# Patient Record
Sex: Male | Born: 1995 | Race: Black or African American | Hispanic: No | Marital: Married | Smoking: Never smoker
Health system: Southern US, Community
[De-identification: ages and names within clinical notes are randomized; demographics above are authoritative.]

---

## 2021-05-17 ENCOUNTER — Other Ambulatory Visit: Payer: Self-pay

## 2021-05-17 ENCOUNTER — Emergency Department (HOSPITAL_COMMUNITY): Payer: Self-pay

## 2021-05-17 ENCOUNTER — Emergency Department (HOSPITAL_COMMUNITY)
Admission: EM | Admit: 2021-05-17 | Discharge: 2021-05-18 | Disposition: A | Discharge status: Home / Self Care | Payer: Self-pay | Attending: Emergency Medicine | Admitting: Emergency Medicine

## 2021-05-17 ENCOUNTER — Encounter (HOSPITAL_COMMUNITY): Payer: Self-pay | Admitting: Emergency Medicine

## 2021-05-17 DIAGNOSIS — X509XXA Other and unspecified overexertion or strenuous movements or postures, initial encounter: Secondary | ICD-10-CM | POA: Insufficient documentation

## 2021-05-17 DIAGNOSIS — Y990 Civilian activity done for income or pay: Secondary | ICD-10-CM | POA: Insufficient documentation

## 2021-05-17 DIAGNOSIS — M542 Cervicalgia: Secondary | ICD-10-CM | POA: Insufficient documentation

## 2021-05-17 DIAGNOSIS — Y9289 Other specified places as the place of occurrence of the external cause: Secondary | ICD-10-CM | POA: Insufficient documentation

## 2021-05-17 NOTE — ED Provider Notes (Signed)
Emergency Medicine Provider Triage Evaluation Note  Mathew Brown , a 24 y.o. male  was evaluated in triage.  Pt complains of neck pain after lifting heavy recliner. Felt a pop in the back of his neck. Pain to the neck and left shoulder, worse with  movement.   Review of Systems  Positive: Neck pain, arthralgias  Negative: Numbness, weakness  Physical Exam  BP 135/73 (BP Location: Right Arm)   Pulse (!) 58   Temp 98.6 F (37 C) (Oral)   Resp 20   SpO2 100%  Gen:   Awake, no distress   Resp:  Normal effort  MSK:   Diffuse cervical tenderness- midline and bilateral paraspinal muscles as well as diffuse L glenohumeral joint tenderness. Sensation grossly intact to UES. 5/5 symmetric grip strength.   Medical Decision Making  Medically screening exam initiated at 11:04 PM.  Appropriate orders placed.  [name] was informed that the remainder of the evaluation will be completed by another provider, this initial triage assessment does not replace that evaluation, and the importance of remaining in the ED until their evaluation is complete.  Neck pain   Cherly Anderson, PA-C 05/17/21 2305    Virgina Norfolk, DO 05/18/21 [phone]

## 2021-05-17 NOTE — ED Triage Notes (Signed)
Pt c/o neck pain after lifting a recliner at work today.

## 2021-05-18 ENCOUNTER — Encounter (HOSPITAL_COMMUNITY): Payer: Self-pay | Admitting: Student

## 2021-05-18 MED ORDER — LIDOCAINE 5 % EX PTCH
2.0000 | MEDICATED_PATCH | CUTANEOUS | Status: DC
  Administered 2021-05-18: 2 via TRANSDERMAL
  Filled 2021-05-18: qty 2

## 2021-05-18 MED ORDER — KETOROLAC TROMETHAMINE 60 MG/2ML IM SOLN
30.0000 mg | Freq: Once | INTRAMUSCULAR | Status: AC
  Administered 2021-05-18: 30 mg via INTRAMUSCULAR
  Filled 2021-05-18: qty 2

## 2021-05-18 MED ORDER — LIDOCAINE 5 % EX PTCH: 1.0000 | MEDICATED_PATCH | Freq: Every day | CUTANEOUS | 0 refills | Status: DC | PRN

## 2021-05-18 MED ORDER — METHOCARBAMOL 500 MG PO TABS: 500.0000 mg | ORAL_TABLET | Freq: Three times a day (TID) | ORAL | 0 refills | Status: DC | PRN

## 2021-05-18 MED ORDER — NAPROXEN 500 MG PO TABS: 500.0000 mg | ORAL_TABLET | Freq: Two times a day (BID) | ORAL | 0 refills | Status: DC | PRN

## 2021-05-18 NOTE — Discharge Instructions (Signed)
You were seen in the emergency department for neck pain.  At this time we suspect that your pain is related to a muscle strain/spasm.   Your CT scan and x-ray did not show findings of fractures.   I have prescribed you an anti-inflammatory medication and a muscle relaxer.  - Naproxen is a nonsteroidal anti-inflammatory medication that will help with pain and swelling. Be sure to take this medication as prescribed with food, 1 pill every 12 hours,  It should be taken with food, as it can cause stomach upset, and more seriously, stomach bleeding. Do not take other nonsteroidal anti-inflammatory medications with this such as Advil, Motrin, Aleve, Mobic, Goodie Powder, or Motrin.    - Robaxin is the muscle relaxer I have prescribed, this is meant to help with muscle tightness. Be aware that this medication may make you drowsy therefore the first time you take this it should be at a time you are in an environment where you can rest. Do not drive or operate heavy machinery when taking this medication. Do not drink alcohol or take other sedating medications with this medicine such as narcotics or benzodiazepines.   - Lidoderm patch- apply 1 patch to the area of most significant pain  You make take Tylenol per over the counter dosing with these medications.   We have prescribed you new medication(s) today. Discuss the medications prescribed today with your pharmacist as they can have adverse effects and interactions with your other medicines including over the counter and prescribed medications. Seek medical evaluation if you start to experience new or abnormal symptoms after taking one of these medicines, seek care immediately if you start to experience difficulty breathing, feeling of your throat closing, facial swelling, or rash as these could be indications of a more serious allergic reaction   The application of heat can help soothe the pain.   Follow up with primary care within 1 week. Return to the  ER for new or worsening symptoms including but not limited to new or worsening pain, numbness, weakness, trouble walking, re-injury, or any other concerns.

## 2021-05-18 NOTE — ED Provider Notes (Signed)
[facility] EMERGENCY DEPARTMENT Provider Note   CSN: [id number] Arrival date & time: 05/17/21  2240     History Chief Complaint  Patient presents with   Neck Pain    Mathew Brown is a 25 y.o. male without significant past medical history who presents to the emergency department with complaints of neck pain after lifting a heavy recliner at work earlier today.  Patient states that he was lifting something heavy felt somewhat of a pop sensation in his neck and has since had pain throughout more so on the left side.  Pain is worse with movement.  No alleviating factors.  No intervention prior to arrival.  Patient denies numbness, tingling, weakness, incontinence, or saddle anesthesia.  Denies any other areas of injury.  HPI     History reviewed. No pertinent past medical history.  There are no problems to display for this patient.   History reviewed. No pertinent surgical history.     No family history on file.     Home Medications Prior to Admission medications   Not on File    Allergies    Patient has no allergy information on record.  Review of Systems   Review of Systems  Constitutional:  Negative for chills, fever and unexpected weight change.  Gastrointestinal:  Negative for abdominal pain, nausea and vomiting.  Genitourinary:  Negative for dysuria.  Musculoskeletal:  Positive for neck pain.  Neurological:  Negative for weakness and numbness.       Negative for saddle anesthesia or bowel/bladder incontinence.   All other systems reviewed and are negative.  Physical Exam Updated Vital Signs BP 128/60 (BP Location: Right Arm)   Pulse (!) 53   Temp 98.6 F (37 C) (Oral)   Resp 18   SpO2 100%   Physical Exam Vitals and nursing note reviewed.  Constitutional:      General: He is not in acute distress.    Appearance: He is well-developed. He is not toxic-appearing.  HENT:     Head: Normocephalic and atraumatic.  Eyes:     General:         Right eye: No discharge.        Left eye: No discharge.     Conjunctiva/sclera: Conjunctivae normal.  Cardiovascular:     Rate and Rhythm: Normal rate and regular rhythm.     Comments: 2+ symmetric radial pulses.  Pulmonary:     Effort: Pulmonary effort is normal. No respiratory distress.     Breath sounds: Normal breath sounds. No wheezing, rhonchi or rales.  Abdominal:     General: There is no distension.     Palpations: Abdomen is soft.     Tenderness: There is no abdominal tenderness. There is no guarding or rebound.  Musculoskeletal:     Cervical back: Tenderness (diffuse midline and bilateral paraspinal muscle more so to the left) present.     Comments: Upper extremities: No obvious deformities or significant open wounds.  Patient is actively ranging all major joints.  He is tender outpatient to the left diffuse glenohumeral joint as well as to the left trapezius muscle and proximal one third of the humerus.  Otherwise nontender.  Compartments are soft.  Skin:    General: Skin is warm and dry.     Capillary Refill: Capillary refill takes less than 2 seconds.     Findings: No rash.  Neurological:     Mental Status: He is alert.     Comments: Sensation grossly intact  to bilateral upper extremities. 5/5 symmetric grip strength. Ambulatory. Clear speech.   Psychiatric:        Behavior: Behavior normal.    ED Results / Procedures / Treatments   Labs (all labs ordered are listed, but only abnormal results are displayed) Labs Reviewed - No data to display  EKG None  Radiology CT Cervical Spine Wo Contrast  Result Date: 05/17/2021 CLINICAL DATA:  Neck trauma, midline tenderness (Age 63-64y) EXAM: CT CERVICAL SPINE WITHOUT CONTRAST TECHNIQUE: Multidetector CT imaging of the cervical spine was performed without intravenous contrast. Multiplanar CT image reconstructions were also generated. COMPARISON:  None. FINDINGS: Alignment: Straightening of normal lordosis. No traumatic  subluxation. Skull base and vertebrae: No acute fracture. Vertebral body heights are maintained. The dens and skull base are intact. Soft tissues and spinal canal: No prevertebral fluid or swelling. No visible canal hematoma. Disc levels:  Preserved. Upper chest: Negative. Other: None. IMPRESSION: Straightening of normal lordosis typically seen with positioning or muscle spasm. No acute fracture or traumatic subluxation of the cervical spine. Electronically Signed   By: Narda Rutherford M.D.   On: 05/17/2021 23:34   DG Shoulder Left  Result Date: 05/17/2021 CLINICAL DATA:  Pain EXAM: LEFT SHOULDER - 2+ VIEW COMPARISON:  None. FINDINGS: There is no evidence of fracture or dislocation. There is no evidence of arthropathy or other focal bone abnormality. Soft tissues are unremarkable. IMPRESSION: Negative. Electronically Signed   By: Tish Frederickson M.D.   On: 05/17/2021 23:21    Procedures Procedures   Medications Ordered in ED Medications - No data to display  ED Course  I have reviewed the triage vital signs and the nursing notes.  Pertinent labs & imaging results that were available during my care of the patient were reviewed by me and considered in my medical decision making (see chart for details).    MDM Rules/Calculators/A&P                          Patient presents to the ED with complaints of neck pain after heavy lifting earlier today. Nontoxic, vitals w/o significant abnormality.   Additional history obtained:  Additional history obtained from chart review & nursing note review.   Imaging Studies ordered:  I ordered imaging studies which included CT C spine & L shoulder x-ray, I independently reviewed, formal radiology impression shows:  CT C-spine: Straightening of normal lordosis typically seen with positioning or muscle spasm. No acute fracture or traumatic subluxation of the cervical spine. L shoulder x-ray: Straightening of normal lordosis typically seen with positioning or  muscle spasm. No acute fracture or traumatic subluxation of the cervical spine.  ED Course:  X-ray & CT imaging reassuring- no acute fracture.  No neuro deficits to suggest cord compression.  NVI distally.  Suspect muscular in nature.  Will treat with naproxen, Robaxin, and Lidoderm patches.  Recommended application of heat.  PCP follow-up.  I discussed results, treatment plan, need for follow-up, and return precautions with the patient. Provided opportunity for questions, patient confirmed understanding and is in agreement with plan.   Portions of this note were generated with Scientist, clinical (histocompatibility and immunogenetics). Dictation errors may occur despite best attempts at proofreading.  Final Clinical Impression(s) / ED Diagnoses Final diagnoses:  Neck pain    Rx / DC Orders ED Discharge Orders          Ordered    methocarbamol (ROBAXIN) 500 MG tablet  Every 8 hours PRN  05/18/21 0400    naproxen (NAPROSYN) 500 MG tablet  2 times daily PRN        05/18/21 0400    lidocaine (LIDODERM) 5 %  Daily PRN        05/18/21 0400             Akari Crysler, Pleas Koch, PA-C 05/18/21 0414    Nira Conn, MD 05/26/21 [phone]

## 2021-05-22 ENCOUNTER — Emergency Department (HOSPITAL_COMMUNITY)
Admission: EM | Admit: 2021-05-22 | Discharge: 2021-05-22 | Disposition: A | Discharge status: Home / Self Care | Payer: Self-pay | Attending: Physician Assistant | Admitting: Physician Assistant

## 2021-05-22 ENCOUNTER — Encounter (HOSPITAL_COMMUNITY): Payer: Self-pay | Admitting: Emergency Medicine

## 2021-05-22 ENCOUNTER — Other Ambulatory Visit: Payer: Self-pay

## 2021-05-22 DIAGNOSIS — M5412 Radiculopathy, cervical region: Secondary | ICD-10-CM | POA: Insufficient documentation

## 2021-05-22 DIAGNOSIS — G8929 Other chronic pain: Secondary | ICD-10-CM | POA: Insufficient documentation

## 2021-05-22 DIAGNOSIS — X500XXA Overexertion from strenuous movement or load, initial encounter: Secondary | ICD-10-CM | POA: Insufficient documentation

## 2021-05-22 DIAGNOSIS — M25569 Pain in unspecified knee: Secondary | ICD-10-CM | POA: Insufficient documentation

## 2021-05-22 DIAGNOSIS — Y990 Civilian activity done for income or pay: Secondary | ICD-10-CM | POA: Insufficient documentation

## 2021-05-22 MED ORDER — PREDNISONE 10 MG PO TABS: ORAL_TABLET | ORAL | 0 refills | Status: DC

## 2021-05-22 NOTE — ED Provider Notes (Signed)
[facility] EMERGENCY DEPARTMENT Provider Note   CSN: [id number] Arrival date & time: 05/22/21  0116     History Chief Complaint  Patient presents with   Knee Pain / Neck Pain     Mathew Brown is a 25 y.o. male presents to the Emergency Department complaining of gradual, persistent, progressively worsening neck pain after injury on Friday.  Patient reports he was lifting a recliner at work when he felt a popping sensation in his neck.  He was evaluated in the emergency department on 05/17/2021 with normal CT scan.  Was given naproxen, Robaxin and lidocaine patches.  He reports these have not been helping significantly however he has not been taking them as prescribed either.  Patient reports he continues to have neck pain and stiffness that radiates into his shoulders but not down his arms.  No numbness, tingling or weakness.  No difficulty walking, loss of bowel or bladder control.  Patient reports some pain in his knees however this is chronic and unchanged.  Movement and palpation seem to make his symptoms worse.  Nothing seems to make them better.     The history is provided by the patient and medical records. No language interpreter was used.      History reviewed. No pertinent past medical history.  There are no problems to display for this patient.   History reviewed. No pertinent surgical history.     No family history on file.  Social History   Tobacco Use   Smoking status: Never   Smokeless tobacco: Never  Substance Use Topics   Alcohol use: Never   Drug use: Never    Home Medications Prior to Admission medications   Medication Sig Start Date End Date Taking? Authorizing Provider  predniSONE (DELTASONE) 10 MG tablet 6 tabs po daily x 3 days, then 4 tabs x 3 days, then 3 tabs x 3 days, then 2 tab x 3 days, then 1 tabs x 3 days 05/22/21  Yes Byrne Capek, Dahlia Client, PA-C  lidocaine (LIDODERM) 5 % Place 1 patch onto the skin daily as needed. Apply  patch to area most significant pain once per day.  Remove and discard patch within 12 hours of application. 05/18/21   Petrucelli, Samantha R, PA-C  methocarbamol (ROBAXIN) 500 MG tablet Take 1 tablet (500 mg total) by mouth every 8 (eight) hours as needed for muscle spasms. 05/18/21   Petrucelli, Samantha R, PA-C  naproxen (NAPROSYN) 500 MG tablet Take 1 tablet (500 mg total) by mouth 2 (two) times daily as needed for moderate pain. 05/18/21   Petrucelli, Pleas Koch, PA-C    Allergies    Patient has no known allergies.  Review of Systems   Review of Systems  Constitutional:  Negative for fatigue and fever.  Respiratory:  Negative for chest tightness and shortness of breath.   Cardiovascular:  Negative for chest pain.  Gastrointestinal:  Negative for abdominal pain, diarrhea, nausea and vomiting.  Genitourinary:  Negative for dysuria, frequency, hematuria and urgency.  Musculoskeletal:  Positive for neck pain. Negative for back pain, gait problem, joint swelling and neck stiffness.  Skin:  Negative for rash.  Neurological:  Negative for weakness, light-headedness, numbness and headaches.  All other systems reviewed and are negative.  Physical Exam Updated Vital Signs BP 126/70 (BP Location: Right Arm)   Pulse (!) 51   Temp 98.6 F (37 C)   Resp 16   Ht 5\' 10"  (1.778 m)   Wt 95 kg  SpO2 100%   BMI 30.05 kg/m   Physical Exam Vitals and nursing note reviewed.  Constitutional:      General: He is not in acute distress.    Appearance: He is well-developed. He is not diaphoretic.  HENT:     Head: Normocephalic and atraumatic.     Nose: Nose normal.     Mouth/Throat:     Pharynx: No oropharyngeal exudate.  Eyes:     Conjunctiva/sclera: Conjunctivae normal.  Neck:     Trachea: Trachea normal.     Comments: Minimally decreased range of motion due to pain Cardiovascular:     Rate and Rhythm: Normal rate and regular rhythm.  Pulmonary:     Effort: Pulmonary effort is normal. No  respiratory distress.     Breath sounds: Normal breath sounds. No wheezing.  Abdominal:     General: There is no distension.     Palpations: Abdomen is soft.     Tenderness: There is no abdominal tenderness.  Musculoskeletal:     Cervical back: Neck supple. Muscular tenderness present. No spinous process tenderness. Decreased range of motion.     Thoracic back: Normal.     Lumbar back: Normal.     Comments: Full range of motion of the T-spine and L-spine No midline tenderness to the  T-spine or L-spine No Tenderness to palpation of the paraspinous muscles of the L-spine  Lymphadenopathy:     Cervical: No cervical adenopathy.  Skin:    General: Skin is warm and dry.     Findings: No erythema or rash.  Neurological:     Mental Status: He is alert.     Comments: Speech is clear and goal oriented, follows commands Normal 5/5 strength in upper and lower extremities bilaterally including dorsiflexion and plantar flexion, strong and equal grip strength Sensation normal to light and sharp touch Moves extremities without ataxia, coordination  Normal gait Normal balance No Clonus   Psychiatric:        Behavior: Behavior normal.    ED Results / Procedures / Treatments     Procedures Procedures   Medications Ordered in ED Medications - No data to display  ED Course  I have reviewed the triage vital signs and the nursing notes.  Pertinent labs & imaging results that were available during my care of the patient were reviewed by me and considered in my medical decision making (see chart for details).    MDM Rules/Calculators/A&P                          Patient presents with ongoing neck pain and several days after injury.  Imaging in the emergency department that evening was reassuring without evidence of fracture or dislocation.  Patient has not been taking medications as directed.  Recommend taking medications as prescribed.  No new injury.  Additional imaging at this time not  indicated.  Additionally will add prednisone and refer to orthopedics.  Discussed reasons to return to the emergency department.  Patient states understanding and is in agreement with the plan.   Final Clinical Impression(s) / ED Diagnoses Final diagnoses:  Cervical radiculopathy    Rx / DC Orders ED Discharge Orders          Ordered    predniSONE (DELTASONE) 10 MG tablet        05/22/21 0252             Fiana Gladu, Dahlia Client, PA-C 05/22/21 7035  Geoffery Lyons, MD 05/22/21 [phone]

## 2021-05-22 NOTE — ED Triage Notes (Signed)
Patient reports persistent pain at posterior lower neck and bilateral knees onset Friday unrelieved by prescription pain medications . Patient stated pain stemmed from work injury.

## 2021-05-22 NOTE — Discharge Instructions (Signed)
1. Medications: add prednisone, usual home medications 2. Treatment: rest, drink plenty of fluids, gentle stretching 3. Follow Up: Please followup with ortho in 5-7 days for discussion of your diagnoses and further evaluation after today's visit; if you do not have a primary care doctor use the resource guide provided to find one; Please return to the ER for new or worsening

## 2022-07-02 ENCOUNTER — Emergency Department: Payer: Self-pay

## 2022-07-02 ENCOUNTER — Emergency Department
Admission: EM | Admit: 2022-07-02 | Discharge: 2022-07-02 | Disposition: A | Discharge status: Home / Self Care | Payer: Self-pay | Attending: Emergency Medicine | Admitting: Emergency Medicine

## 2022-07-02 ENCOUNTER — Other Ambulatory Visit: Payer: Self-pay

## 2022-07-02 DIAGNOSIS — R079 Chest pain, unspecified: Secondary | ICD-10-CM

## 2022-07-02 DIAGNOSIS — R0789 Other chest pain: Secondary | ICD-10-CM | POA: Insufficient documentation

## 2022-07-02 DIAGNOSIS — M25512 Pain in left shoulder: Secondary | ICD-10-CM | POA: Insufficient documentation

## 2022-07-02 LAB — BASIC METABOLIC PANEL
Anion gap: 5 (ref 5–15)
BUN: 12 mg/dL (ref 6–20)
CO2: 27 mmol/L (ref 22–32)
Calcium: 9.2 mg/dL (ref 8.9–10.3)
Chloride: 109 mmol/L (ref 98–111)
Creatinine, Ser: 1.09 mg/dL (ref 0.61–1.24)
GFR, Estimated: >60 mL/min (ref >60)
Glucose, Bld: 88 mg/dL (ref 70–99)
Potassium: 3.9 mmol/L (ref 3.5–5.1)
Sodium: 141 mmol/L (ref 135–145)

## 2022-07-02 LAB — CBC
HCT: 40.8 % (ref 39.0–52.0)
Hemoglobin: 13.6 g/dL (ref 13.0–17.0)
MCH: 27.9 pg (ref 26.0–34.0)
MCHC: 33.3 g/dL (ref 30.0–36.0)
MCV: 83.6 fL (ref 80.0–100.0)
Platelets: 116 10*3/uL — ABNORMAL LOW (ref 150–400)
RBC: 4.88 MIL/uL (ref 4.22–5.81)
RDW: 13.9 % (ref 11.5–15.5)
WBC: 4.7 10*3/uL (ref 4.0–10.5)
nRBC: 0 % (ref 0.0–0.2)

## 2022-07-02 LAB — TROPONIN I (HIGH SENSITIVITY): Troponin I (High Sensitivity): 3 ng/L (ref <18)

## 2022-07-02 MED ORDER — METHOCARBAMOL 500 MG PO TABS: 500.0000 mg | ORAL_TABLET | Freq: Three times a day (TID) | ORAL | 0 refills | Status: DC | PRN

## 2022-07-02 NOTE — ED Provider Notes (Signed)
[facility] Emergency Dept Provider Note   Event Date/Time   First MD Initiated Contact with Patient 07/02/22 1251     (approximate) History  Chest Pain  HPI Awesome Mathew Brown is a 26 y.o. male with a past medical history who presents for sharp left-sided chest pain that radiates into his axilla and left shoulder.  Patient states that this pain has remained relatively stable but is certainly worse with movements and is rated as a 6/10.  Patient describes his pain as aching.  This chest pain is nonexertional in nature ROS: Patient currently denies any vision changes, tinnitus, difficulty speaking, facial droop, sore throat, shortness of breath, abdominal pain, nausea/vomiting/diarrhea, dysuria, or weakness/numbness/paresthesias in any extremity   Physical Exam  Triage Vital Signs: ED Triage Vitals  Enc Vitals Group     BP 07/02/22 1151 (!) 117/58     Pulse Rate 07/02/22 1151 (!) 56     Resp 07/02/22 1151 17     Temp 07/02/22 1151 98.2 F (36.8 C)     Temp Source 07/02/22 1151 Oral     SpO2 07/02/22 1151 100 %     Weight 07/02/22 1152 188 lb (85.3 kg)     Height 07/02/22 1152 5\' 9"  (1.753 m)     Head Circumference --      Peak Flow --      Pain Score 07/02/22 1151 6     Pain Loc --      Pain Edu? --      Excl. in GC? --    Most recent vital signs: Vitals:   07/02/22 1151 07/02/22 1444  BP: (!) 117/58 (!) 107/56  Pulse: (!) 56 66  Resp: 17 17  Temp: 98.2 F (36.8 C)   SpO2: 100% 99%   General: Awake, oriented x4. CV:  Good peripheral perfusion.  Resp:  Normal effort.  Abd:  No distention.  Other:  Young adult African-American male laying in bed in no acute distress ED Results / Procedures / Treatments  Labs (all labs ordered are listed, but only abnormal results are displayed) Labs Reviewed  CBC - Abnormal; Notable for the following components:      Result Value   Platelets 116 (*)    All other components within normal limits  BASIC METABOLIC PANEL  TROPONIN  I (HIGH SENSITIVITY)   EKG ED ECG REPORT I, 09/01/22, the attending physician, personally viewed and interpreted this ECG. Date: 07/02/2022 EKG Time: 1147 Rate: 63 Rhythm: normal sinus rhythm QRS Axis: normal Intervals: normal ST/T Wave abnormalities: normal Narrative Interpretation: no evidence of acute ischemia RADIOLOGY ED MD interpretation: 2 view chest x-ray interpreted by me shows no evidence of acute abnormalities including no pneumonia, pneumothorax, or widened mediastinum -Agree with radiology assessment Official radiology report(s): DG Chest 2 View  Result Date: 07/02/2022 CLINICAL DATA:  Chest pain. EXAM: CHEST - 2 VIEW COMPARISON:  None Available. FINDINGS: The heart size and mediastinal contours are within normal limits. Both lungs are clear. The visualized skeletal structures are unremarkable. IMPRESSION: No active cardiopulmonary disease. Electronically Signed   By: 09/01/2022 M.D.   On: 07/02/2022 12:27   PROCEDURES: Critical Care performed: No .1-3 Lead EKG Interpretation  Performed by: 09/01/2022, MD Authorized by: Merwyn Katos, MD     Interpretation: normal     ECG rate:  65   ECG rate assessment: normal     Rhythm: sinus rhythm     Ectopy: none  Conduction: normal    MEDICATIONS ORDERED IN ED: Medications - No data to display IMPRESSION / MDM / ASSESSMENT AND PLAN / ED COURSE  I reviewed the triage vital signs and the nursing notes.                             The patient is on the cardiac monitor to evaluate for evidence of arrhythmia and/or significant heart rate changes. Patient's presentation is most consistent with acute presentation with potential threat to life or bodily function. This patient presents with atypical chest pain, most likely secondary to musculoskeletal injury. Differential diagnosis includes rib fracture, costochondritis, sternal fracture. Low suspicion for ACS, acute PE (PERC negative), pericarditis /  myocarditis, thoracic aortic dissection, pneumothorax, pneumonia or other acute infectious process. Presentation not consistent with other acute, emergent causes of chest pain at this time. No indication for cardiac enzyme testing. Plan to order CXR to evaluate for acute cardiopulmonary causes.  Plan: EKG, CXR, pain control  Dispo: Discharge home with home care   FINAL CLINICAL IMPRESSION(S) / ED DIAGNOSES   Final diagnoses:  Chest pain, unspecified type  Acute pain of left shoulder   Rx / DC Orders   ED Discharge Orders          Ordered    methocarbamol (ROBAXIN) 500 MG tablet  Every 8 hours PRN,   Status:  Discontinued        07/02/22 1435    methocarbamol (ROBAXIN) 500 MG tablet  Every 8 hours PRN        07/02/22 1436           Note:  This document was prepared using Dragon voice recognition software and may include unintentional dictation errors.   Merwyn Katos, MD 07/02/22 [phone]

## 2022-07-02 NOTE — ED Triage Notes (Signed)
First Nurse: Pt here via [facility] with CP since 1700 yesterday. Pt states pain is sharp and stabbing and worse with movement, pain is left sided radiating to arm. 324 of ASA given and 2 nitro by ems. Some EKG changes noted.   92-cbg 18G LAC

## 2022-07-02 NOTE — ED Triage Notes (Signed)
Pt complains of sharp chest pain that began yesterday at 5pm. Pt states the pain was worse today so he called EMS. Pt does state the chest pain is worse with movements

## 2022-11-21 ENCOUNTER — Emergency Department
Admission: EM | Admit: 2022-11-21 | Discharge: 2022-11-21 | Disposition: A | Discharge status: Home / Self Care | Payer: Self-pay | Attending: Emergency Medicine | Admitting: Emergency Medicine

## 2022-11-21 ENCOUNTER — Encounter: Payer: Self-pay | Admitting: Emergency Medicine

## 2022-11-21 ENCOUNTER — Other Ambulatory Visit: Payer: Self-pay

## 2022-11-21 DIAGNOSIS — H6123 Impacted cerumen, bilateral: Secondary | ICD-10-CM | POA: Insufficient documentation

## 2022-11-21 DIAGNOSIS — H6122 Impacted cerumen, left ear: Secondary | ICD-10-CM

## 2022-11-21 DIAGNOSIS — H6121 Impacted cerumen, right ear: Secondary | ICD-10-CM | POA: Insufficient documentation

## 2022-11-21 DIAGNOSIS — H7292 Unspecified perforation of tympanic membrane, left ear: Secondary | ICD-10-CM | POA: Insufficient documentation

## 2022-11-21 MED ORDER — MELOXICAM 15 MG PO TABS: 15.0000 mg | ORAL_TABLET | Freq: Every day | ORAL | 11 refills | Status: AC

## 2022-11-21 MED ORDER — AMOXICILLIN-POT CLAVULANATE 875-125 MG PO TABS: 1.0000 | ORAL_TABLET | Freq: Two times a day (BID) | ORAL | 0 refills | Status: AC

## 2022-11-21 MED ORDER — CARBAMIDE PEROXIDE 6.5 % OT SOLN: 5.0000 [drp] | Freq: Two times a day (BID) | OTIC | 0 refills | Status: AC

## 2022-11-21 NOTE — Discharge Instructions (Signed)
Begin using the Debrox eardrops for wax impaction.  You may also obtain a bulb syringe and irrigate your ears with warm body temperature water.  If you continue having problems you will need to make an appoint with Dr. Willeen Cass who is on-call for [place] [facility].  His address and phone number listed on your discharge papers.

## 2022-11-21 NOTE — ED Provider Notes (Signed)
[facility] Provider Note    Event Date/Time   First MD Initiated Contact with Patient 11/21/22 [phone]     (approximate)   History   Otalgia   HPI  Mathew Brown is a 26 y.o. male   to the ED with complaint of right ear pain and decreased hearing.  He states that he has been having problems with his ears since he was 26 years old.  He also complains of his left ear giving him problems also.  He denies any fever, chills, upper respiratory infection symptoms.      Physical Exam   Triage Vital Signs: ED Triage Vitals [11/21/22 0813]  Enc Vitals Group     BP 128/77     Pulse Rate (!) 51     Resp 20     Temp 97.7 F (36.5 C)     Temp Source Oral     SpO2 100 %     Weight 187 lb 6.3 oz (85 kg)     Height 5\' 9"  (1.753 m)     Head Circumference      Peak Flow      Pain Score 7     Pain Loc      Pain Edu?      Excl. in [place]?     Most recent vital signs: Vitals:   11/21/22 0813  BP: 128/77  Pulse: (!) 51  Resp: 20  Temp: 97.7 F (36.5 C)  SpO2: 100%     General: Awake, no distress.  CV:  Good peripheral perfusion.  Resp:  Normal effort.  Abd:  No distention.  Other:  Bilateral EACs are occluded with cerumen.  No drainage.  No erythema in the canal.   ED Results / Procedures / Treatments   Labs (all labs ordered are listed, but only abnormal results are displayed) Labs Reviewed - No data to display    PROCEDURES:  Critical Care performed:   Procedures   MEDICATIONS ORDERED IN ED: Medications - No data to display   IMPRESSION / MDM / ASSESSMENT AND PLAN / ED COURSE  I reviewed the triage vital signs and the nursing notes.   Differential diagnosis includes, but is not limited to, otitis externa, otitis media, cerumen impaction, ruptured tympanic membrane.  26 year old male presents to the ED with complaint of right ear pain but also having problems with his left ear and also decreased hearing in both.  Patient states he has  had problems with cerumen impaction since age 73.  Today he continues to have cerumen impaction.  A prescription for Debrox was sent to the pharmacy with instructions to use a bulb syringe to irrigate his ears.  Patient is to call make an appointment with Dr. 12 who is on-call for [facility] if he continues to have problems with his ears and hearing.        Patient's presentation is most consistent with acute complicated illness / injury requiring diagnostic workup.  FINAL CLINICAL IMPRESSION(S) / ED DIAGNOSES   Final diagnoses:  Bilateral impacted cerumen     Rx / DC Orders   ED Discharge Orders          Ordered    carbamide peroxide (DEBROX) 6.5 % OTIC solution  2 times daily        11/21/22 0845             Note:  This document was prepared using Dragon voice recognition software and may include unintentional dictation  errors.   Tommi Rumps, PA-C 11/21/22 0915    Jene Every, MD 11/21/22 1450

## 2022-11-21 NOTE — ED Triage Notes (Signed)
Pt reports for the last couple of years he has not been able to hear really well out of his left ear and this am it started hurting.

## 2022-11-21 NOTE — ED Triage Notes (Signed)
Left ear pain today.

## 2022-11-21 NOTE — ED Provider Notes (Signed)
[facility] Provider Note    None    (approximate)   History   Chief Complaint Otalgia   HPI Mathew Brown is a 26 y.o. male, no significant medical history, presents to the emergency department for evaluation of otalgia.  Patient states that he has been experiencing pain in the left ear today.  He was seen earlier today for cerumen impaction and prescribed Debrox.  Since being at home, he is attempted to manually remove the cerumen, however became concerned when he began to experience bleeding from the left ear.  He states that he feels like he ruptured his eardrum.  Denies any other symptoms at this time.  History Limitations: No limitations.        Physical Exam  Triage Vital Signs: ED Triage Vitals [11/21/22 1216]  Enc Vitals Group     BP      Pulse      Resp      Temp      Temp src      SpO2      Weight 187 lb 6.3 oz (85 kg)     Height 5\' 9"  (1.753 m)     Head Circumference      Peak Flow      Pain Score 5     Pain Loc      Pain Edu?      Excl. in [place]?     Most recent vital signs: Vitals:   11/21/22 1253  BP: 129/86  Pulse: (!) 58  Resp: 16  Temp: 98.2 F (36.8 C)  SpO2: 100%    General: Awake, NAD.  Skin: Warm, dry. No rashes or lesions.  Eyes: PERRL. Conjunctivae normal.  CV: Good peripheral perfusion.  Resp: Normal effort.  Abd: Soft, non-tender. No distention.  Neuro: At baseline. No gross neurological deficits.  Musculoskeletal: Normal ROM of all extremities.  Focused Exam: Right ear exam shows significant cerumen impaction.  Unable to visualize the TM.  Left ear exam shows significant cerumen impaction as well, though there does appear to be some drainage of blood.  Unable to fully visualize the TM at this time.  Physical Exam    ED Results / Procedures / Treatments  Labs (all labs ordered are listed, but only abnormal results are displayed) Labs Reviewed - No data to display   EKG N/A.    RADIOLOGY  ED  Provider Interpretation: N/A.  No results found.  PROCEDURES:  Critical Care performed: N/A.  Procedures    MEDICATIONS ORDERED IN ED: Medications - No data to display   IMPRESSION / MDM / [place] / ED COURSE  I reviewed the triage vital signs and the nursing notes.                              Differential diagnosis includes, but is not limited to, tympanic membrane rupture, otitis media, otitis externa, cerumen impaction.  Assessment/Plan Patient presents with cerumen impaction has not been complicated with bleeding coming from the auditory canal of the right ear.  On exam, unable to visualize the TM, however given the patient's history of attempted manual removal, I suspect likely TM perforation/rupture.  Will provide her with a course of antibiotics and referral to ENT for further evaluation.  Advised him to avoid any Debrox or attempts at manual removal of the cerumen at this time until he can be seen by ENT.  Provide him with  a short course of analgesics as well.  He was amenable to this plan.  Will discharge.  Provided the patient with anticipatory guidance, return precautions, and educational material. Encouraged the patient to return to the emergency department at any time if they begin to experience any new or worsening symptoms. Patient expressed understanding and agreed with the plan.   Patient's presentation is most consistent with acute, uncomplicated illness.       FINAL CLINICAL IMPRESSION(S) / ED DIAGNOSES   Final diagnoses:  Tympanic membrane rupture, left  Impacted cerumen of left ear     Rx / DC Orders   ED Discharge Orders          Ordered    amoxicillin-clavulanate (AUGMENTIN) 875-125 MG tablet  2 times daily        11/21/22 1246    meloxicam (MOBIC) 15 MG tablet  Daily        11/21/22 1248             Note:  This document was prepared using Dragon voice recognition software and may include unintentional dictation errors.    Varney Daily, [place] 11/21/22 Herbert Seta, MD 11/22/22 [phone]

## 2022-11-21 NOTE — ED Notes (Signed)
This RN reviewed paperwork with pt. No further complaints or questions. Pt ambulated to lobby. Signed discharge form placed in med rec box.  

## 2022-11-21 NOTE — Discharge Instructions (Addendum)
-  Please take the antibiotics as prescribed.  Do not put anything into your left ear until you can be seen by ENT.  -You may take the meloxicam as needed for pain.  You may also take Tylenol.  -Call the ENT specialist today to schedule an appointment.  Let them know that you were seen here in the emergency department.  -Return to the emergency department anytime if you begin to experience any new or worsening symptoms.

## 2023-01-31 IMAGING — CR DG SHOULDER 2+V*L*
4 series · 4 of 4 positions shown · non-contrast
Comparison: None.

CLINICAL DATA: Pain

EXAM:
LEFT SHOULDER - 2+ VIEW

[shoulder grashey]
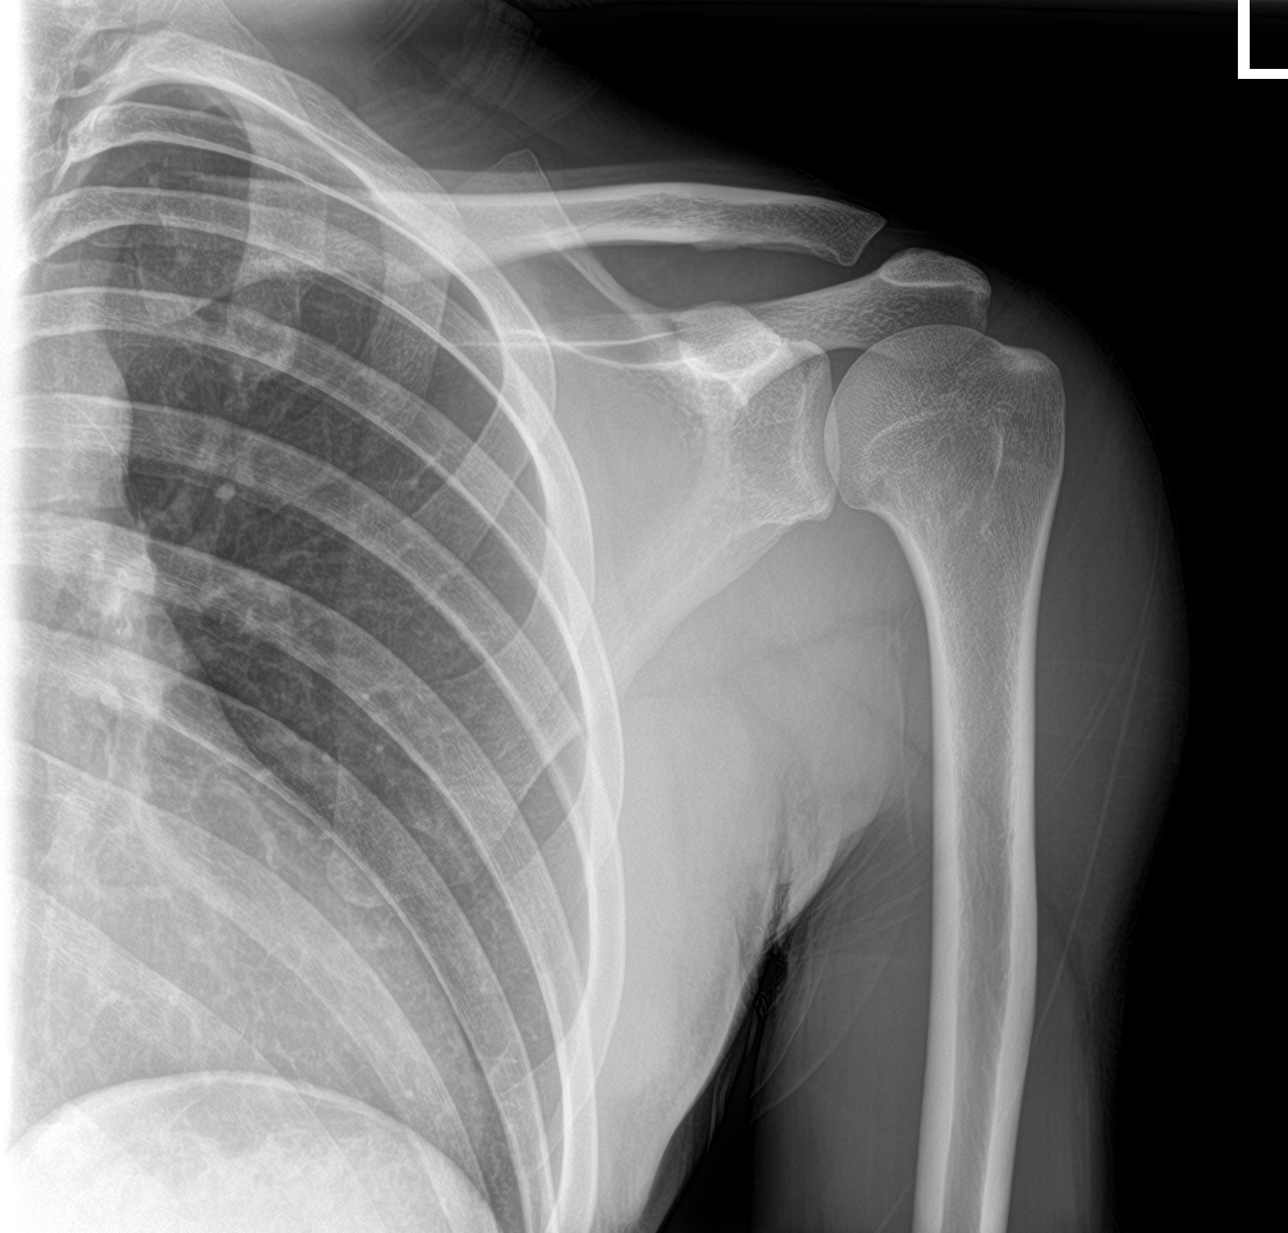

[shoulder y view]
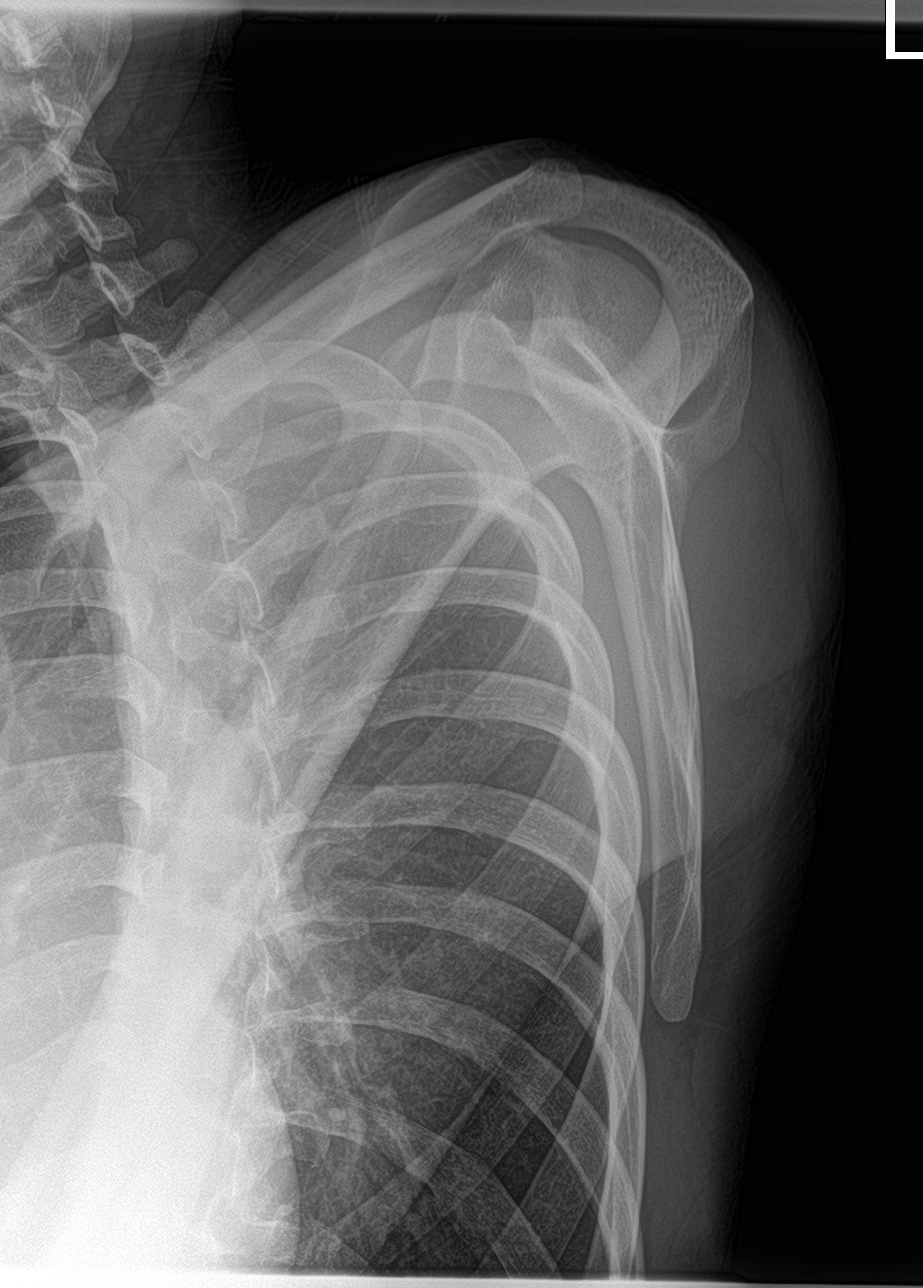

[shoulder axillary]
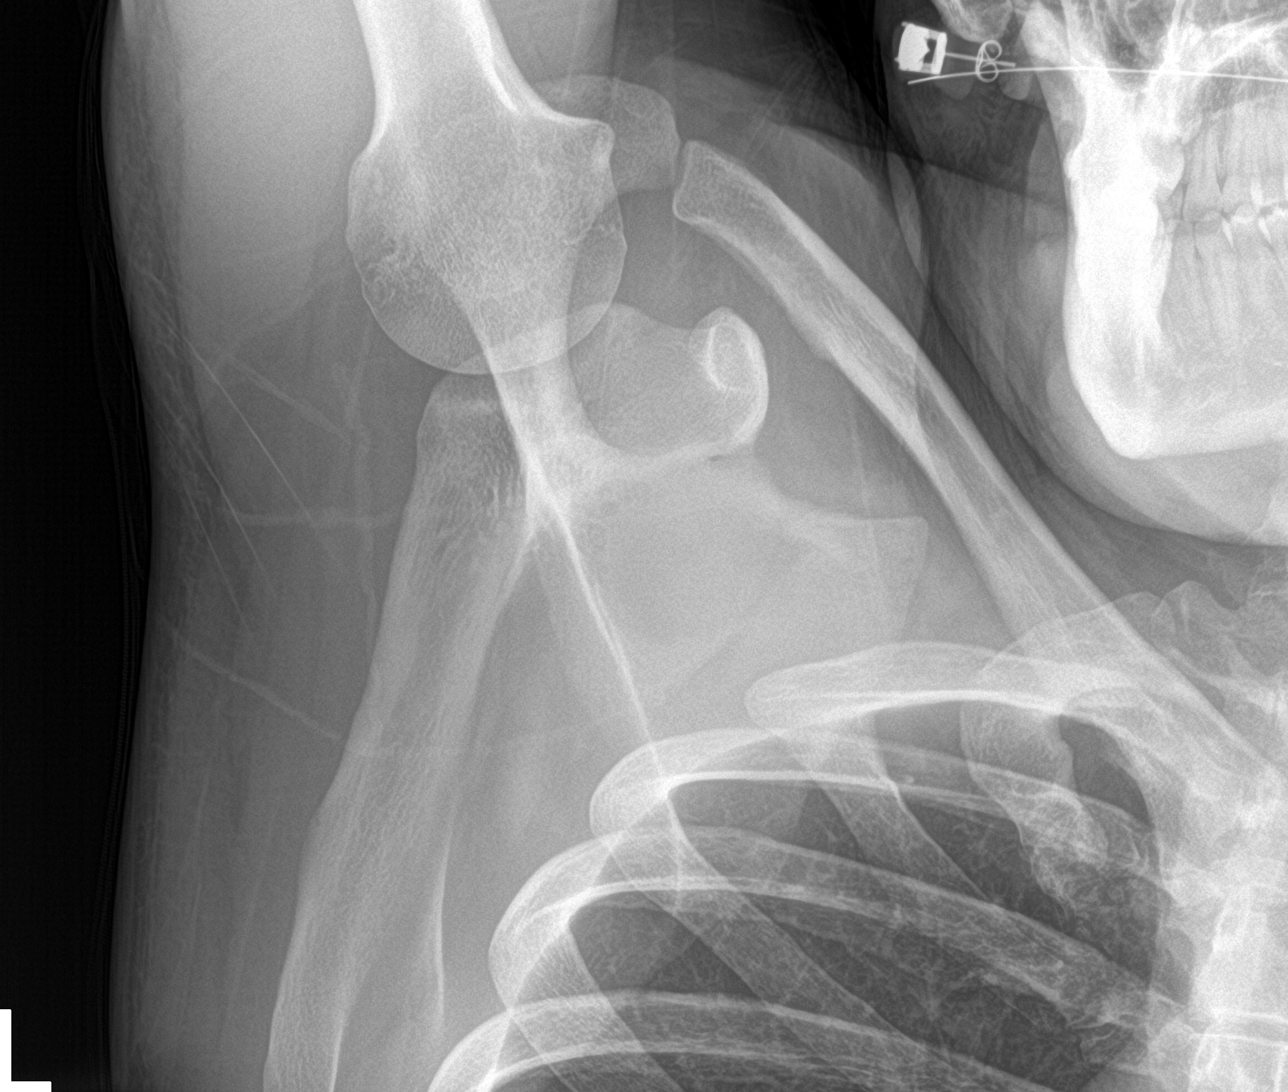

[shoulder ap neutral]
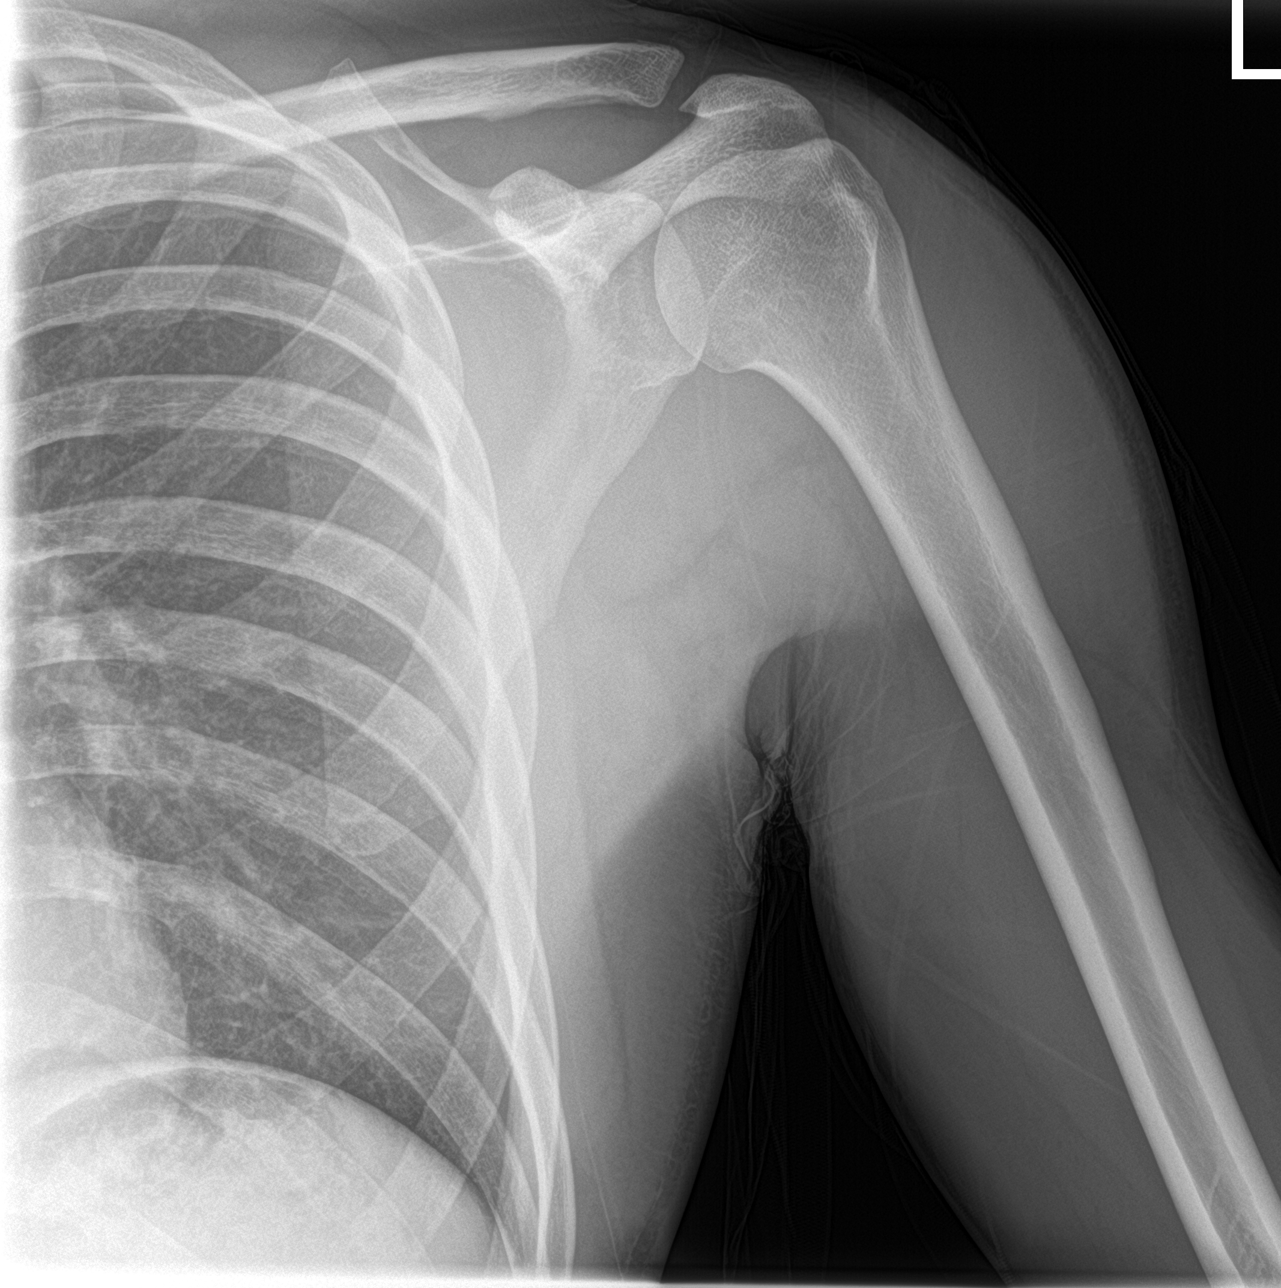

[4 of 4 positions shown; findings below may reference images not displayed]

FINDINGS: There is no evidence of fracture or dislocation. There is no
evidence of arthropathy or other focal bone abnormality. Soft
tissues are unremarkable.
IMPRESSION: Negative.

## 2024-05-17 ENCOUNTER — Ambulatory Visit
Admission: EM | Admit: 2024-05-17 | Discharge: 2024-05-17 | Disposition: A | Discharge status: Home / Self Care | Attending: Family Medicine | Admitting: Family Medicine

## 2024-05-17 DIAGNOSIS — H6123 Impacted cerumen, bilateral: Secondary | ICD-10-CM | POA: Diagnosis not present

## 2024-05-17 NOTE — Discharge Instructions (Signed)
 Follow up as needed

## 2024-05-17 NOTE — ED Provider Notes (Signed)
 [facility]    CSN: [id number] Arrival date & time: 05/17/24  1154      History   Chief Complaint Chief Complaint  Patient presents with   Ear Fullness    HPI Mathew Brown is a 28 y.o. male presents for clogged ears.  Patient reports history of cerumen impaction reports over the past couple months has had worsening  diminished hearing in bilateral ears as well as feeling of fullness.  Is having intermittent tinnitus but no pain or drainage.  No URI symptoms.  He does not wear ear buds or use Q-tips.  No other concerns at this time.   Ear Fullness    History reviewed. No pertinent past medical history.  There are no active problems to display for this patient.   History reviewed. No pertinent surgical history.     Home Medications    Prior to Admission medications   Not on File    Family History History reviewed. No pertinent family history.  Social History Social History   Tobacco Use   Smoking status: Never   Smokeless tobacco: Current    Types: Chew, Snuff  Vaping Use   Vaping status: Never Used  Substance Use Topics   Alcohol use: Never   Drug use: Never     Allergies   Patient has no known allergies.   Review of Systems Review of Systems  HENT:         Ear clogged/hearing changes     Physical Exam Triage Vital Signs ED Triage Vitals  Encounter Vitals Group     BP 05/17/24 1208 114/68     Girls Systolic BP Percentile --      Girls Diastolic BP Percentile --      Boys Systolic BP Percentile --      Boys Diastolic BP Percentile --      Pulse Rate 05/17/24 1206 60     Resp 05/17/24 1206 18     Temp 05/17/24 1208 98.1 F (36.7 C)     Temp src --      SpO2 05/17/24 1206 98 %     Weight --      Height --      Head Circumference --      Peak Flow --      Pain Score 05/17/24 1206 9     Pain Loc --      Pain Education --      Exclude from Growth Chart --    No data found.  Updated Vital Signs BP 114/68   Pulse 60    Temp 98.1 F (36.7 C)   Resp 18   SpO2 98%   Visual Acuity Right Eye Distance:   Left Eye Distance:   Bilateral Distance:    Right Eye Near:   Left Eye Near:    Bilateral Near:     Physical Exam Vitals and nursing note reviewed.  Constitutional:      General: He is not in acute distress.    Appearance: Normal appearance. He is not ill-appearing or toxic-appearing.  HENT:     Head: Normocephalic and atraumatic.     Right Ear: Tympanic membrane and ear canal normal. There is impacted cerumen.     Left Ear: Tympanic membrane and ear canal normal. There is impacted cerumen.     Ears:     Comments: After irrigation bilateral TMs and canals within normal limits.    Nose: Nose normal. No congestion.   Cardiovascular:  Rate and Rhythm: Regular rhythm.  Pulmonary:     Effort: Pulmonary effort is normal.   Musculoskeletal:     Cervical back: Normal range of motion and neck supple.  Lymphadenopathy:     Cervical: No cervical adenopathy.   Skin:    General: Skin is warm and dry.   Neurological:     General: No focal deficit present.     Mental Status: He is alert and oriented to person, place, and time.   Psychiatric:        Mood and Affect: Mood normal.        Behavior: Behavior normal.      UC Treatments / Results  Labs (all labs ordered are listed, but only abnormal results are displayed) Labs Reviewed - No data to display  EKG   Radiology No results found.  Procedures Procedures (including critical care time)  Medications Ordered in UC Medications - No data to display  Initial Impression / Assessment and Plan / UC Course  I have reviewed the triage vital signs and the nursing notes.  Pertinent labs & imaging results that were available during my care of the patient were reviewed by me and considered in my medical decision making (see chart for details).     Reviewed exam and symptoms with patient.  Patient tolerated irrigation well and after words  bilateral TMs and canals within normal limits.  Advise follow-up as needed. Final Clinical Impressions(s) / UC Diagnoses   Final diagnoses:  Bilateral impacted cerumen     Discharge Instructions      Follow-up as needed     ED Prescriptions   None    PDMP not reviewed this encounter.   Loreda Myla SAUNDERS, NP 05/17/24 [phone]

## 2024-05-17 NOTE — ED Triage Notes (Signed)
 Pt present with c/o bilateral ear fullness. Pt states he has ear wax built up in both ears and c/o pain.

## 2024-08-29 ENCOUNTER — Other Ambulatory Visit: Payer: Self-pay

## 2024-08-29 ENCOUNTER — Emergency Department
Admission: EM | Admit: 2024-08-29 | Discharge: 2024-08-29 | Disposition: A | Discharge status: Home / Self Care | Attending: Emergency Medicine | Admitting: Emergency Medicine

## 2024-08-29 ENCOUNTER — Emergency Department

## 2024-08-29 DIAGNOSIS — N50811 Right testicular pain: Secondary | ICD-10-CM | POA: Insufficient documentation

## 2024-08-29 DIAGNOSIS — N50812 Left testicular pain: Secondary | ICD-10-CM | POA: Diagnosis not present

## 2024-08-29 LAB — URINALYSIS, ROUTINE W REFLEX MICROSCOPIC
Bilirubin Urine: NEGATIVE
Glucose, UA: NEGATIVE mg/dL
Hgb urine dipstick: NEGATIVE
Ketones, ur: NEGATIVE mg/dL
Leukocytes,Ua: NEGATIVE
Nitrite: NEGATIVE
Protein, ur: NEGATIVE mg/dL
Specific Gravity, Urine: 1.016 (ref 1.005–1.030)
pH: 5 (ref 5.0–8.0)

## 2024-08-29 LAB — CHLAMYDIA/NGC RT PCR (ARMC ONLY)
Chlamydia Tr: NOT DETECTED
N gonorrhoeae: NOT DETECTED

## 2024-08-29 MED ORDER — ACETAMINOPHEN 500 MG PO TABS
1000.0000 mg | ORAL_TABLET | Freq: Once | ORAL | Status: AC
  Administered 2024-08-29: 1000 mg via ORAL
  Filled 2024-08-29: qty 2

## 2024-08-29 NOTE — ED Triage Notes (Addendum)
 Pt arrives from home via [facility] C/O sharp testicular pain x 1 month and worsening in the past hour.  Denies any urinary symptoms.  Denies Injury.

## 2024-08-29 NOTE — ED Provider Notes (Signed)
 [facility] Provider Note    Event Date/Time   First MD Initiated Contact with Patient 08/29/24 0148     (approximate)   History   Testicle Pain   HPI  Mathew Brown is a 28 y.o. male no significant past medical history who presents to the emergency department for testicle pain.  States that he has had ongoing testicular pain for the past 1 month.  He is unable to state which testicle is painful today.  Denies any falls or trauma.  He is uncertain of why he decided to be seen tonight after 1 month of testicular pain.  Denies any worsening pain.  Has not taken anything for the pain.  Denies any dysuria, urinary urgency or frequency.  Denies any history of kidney stone.  No flank pain.  No fever or chills.  Denies any penile discharge.  Denies any concern for an STI.     Physical Exam   Triage Vital Signs: ED Triage Vitals  Encounter Vitals Group     BP 08/29/24 0150 134/81     Girls Systolic BP Percentile --      Girls Diastolic BP Percentile --      Boys Systolic BP Percentile --      Boys Diastolic BP Percentile --      Pulse Rate 08/29/24 0150 63     Resp 08/29/24 0150 20     Temp 08/29/24 0150 98.2 F (36.8 C)     Temp Source 08/29/24 0150 Oral     SpO2 08/29/24 0150 100 %     Weight 08/29/24 0152 220 lb (99.8 kg)     Height 08/29/24 0152 5' 9 (1.753 m)     Head Circumference --      Peak Flow --      Pain Score 08/29/24 0151 10     Pain Loc --      Pain Education --      Exclude from Growth Chart --     Most recent vital signs: Vitals:   08/29/24 0150 08/29/24 0332  BP: 134/81 132/60  Pulse: 63 (!) 51  Resp: 20 16  Temp: 98.2 F (36.8 C)   SpO2: 100% 100%    Physical Exam Exam conducted with a chaperone present.  Constitutional:      Appearance: He is well-developed.  HENT:     Head: Atraumatic.  Eyes:     Conjunctiva/sclera: Conjunctivae normal.  Cardiovascular:     Rate and Rhythm: Regular rhythm.  Pulmonary:      Effort: No respiratory distress.  Genitourinary:    Penis: Normal.      Testes: Normal. Cremasteric reflex is present.        Right: Tenderness or swelling not present.        Left: Tenderness or swelling not present.     Epididymis:     Right: Normal.     Left: Normal.  Musculoskeletal:     Cervical back: Normal range of motion.  Skin:    General: Skin is warm.  Neurological:     Mental Status: He is alert. Mental status is at baseline.     IMPRESSION / MDM / ASSESSMENT AND PLAN / ED COURSE  I reviewed the triage vital signs and the nursing notes.  Differential diagnosis including testicular torsion, epididymitis, urinary tract infection, kidney stone, hernia, hydrocele  RADIOLOGY Scrotal ultrasound with no signs of torsion.  No signs of epididymitis. LABS (all labs ordered are listed, but only  abnormal results are displayed) Labs interpreted as -    Labs Reviewed  URINALYSIS, ROUTINE W REFLEX MICROSCOPIC - Abnormal; Notable for the following components:      Result Value   Color, Urine YELLOW (*)    APPearance CLEAR (*)    All other components within normal limits  CHLAMYDIA/NGC RT PCR ([facility] ONLY)               MDM  UA no signs of urinary tract infection.  No signs of blood in the urine I have low suspicion for kidney stone.  Clinical picture is not consistent with an epididymitis.  No signs of hernia on exam.  Ultrasound with no signs of torsion.  Discussed outpatient follow-up with primary care physician and given a referral.  Given information to follow-up as an outpatient with urology.  Discussed return precautions for any ongoing or worsening symptoms.  No questions at time of discharge.     PROCEDURES:  Critical Care performed: No  Procedures  Patient's presentation is most consistent with acute presentation with potential threat to life or bodily function.   MEDICATIONS ORDERED IN ED: Medications  acetaminophen (TYLENOL) tablet 1,000 mg (1,000 mg Oral  Given 08/29/24 0233)    FINAL CLINICAL IMPRESSION(S) / ED DIAGNOSES   Final diagnoses:  Pain in both testicles     Rx / DC Orders   ED Discharge Orders          Ordered    Ambulatory Referral to Primary Care (Establish Care)        08/29/24 0324             Note:  This document was prepared using Dragon voice recognition software and may include unintentional dictation errors.   Suzanne Kirsch, MD 08/29/24 [phone]

## 2024-08-29 NOTE — Discharge Instructions (Addendum)
 You are seen in the emergency department for testicular pain.  Your ultrasound did not show an obvious reason for your testicular pain.  Your urine did not appear infected and did not have any blood in your urine.  You will be notified if your gonorrhea/chlamydia testing results is positive, if positive you will need further treatment with antibiotics.  You are given information to follow-up as an outpatient with primary care.  You are given information for urology follow-up, call on Monday morning for follow-up.  Return to the emergency department for any ongoing or worsening symptoms.  Pain control:  Ibuprofen (motrin/aleve karolyn) - You can take 3 tablets (600 mg) every 6 hours as needed for pain/fever.  Acetaminophen (tylenol) - You can take 2 extra strength tablets (1000 mg) every 6 hours as needed for pain/fever.  You can alternate these medications or take them together.  Make sure you eat food/drink water when taking these medications.

## 2024-11-16 ENCOUNTER — Emergency Department

## 2024-11-16 ENCOUNTER — Encounter: Payer: Self-pay | Admitting: Emergency Medicine

## 2024-11-16 ENCOUNTER — Other Ambulatory Visit: Payer: Self-pay

## 2024-11-16 ENCOUNTER — Emergency Department
Admission: EM | Admit: 2024-11-16 | Discharge: 2024-11-16 | Disposition: A | Discharge status: Home / Self Care | Attending: Emergency Medicine | Admitting: Emergency Medicine

## 2024-11-16 DIAGNOSIS — R079 Chest pain, unspecified: Secondary | ICD-10-CM | POA: Diagnosis present

## 2024-11-16 DIAGNOSIS — R0789 Other chest pain: Secondary | ICD-10-CM | POA: Diagnosis not present

## 2024-11-16 LAB — COMPREHENSIVE METABOLIC PANEL WITH GFR
ALT: 11 U/L (ref 0–44)
AST: 19 U/L (ref 15–41)
Albumin: 4.8 g/dL (ref 3.5–5.0)
Alkaline Phosphatase: 48 U/L (ref 38–126)
Anion gap: 11 (ref 5–15)
BUN: 12 mg/dL (ref 6–20)
CO2: 29 mmol/L (ref 22–32)
Calcium: 9.5 mg/dL (ref 8.9–10.3)
Chloride: 102 mmol/L (ref 98–111)
Creatinine, Ser: 1 mg/dL (ref 0.61–1.24)
GFR, Estimated: >60 mL/min
Glucose, Bld: 103 mg/dL — ABNORMAL HIGH (ref 70–99)
Potassium: 3.9 mmol/L (ref 3.5–5.1)
Sodium: 141 mmol/L (ref 135–145)
Total Bilirubin: 0.5 mg/dL (ref 0.0–1.2)
Total Protein: 7.5 g/dL (ref 6.5–8.1)

## 2024-11-16 LAB — CBC
HCT: 42.9 % (ref 39.0–52.0)
Hemoglobin: 14.4 g/dL (ref 13.0–17.0)
MCH: 28 pg (ref 26.0–34.0)
MCHC: 33.6 g/dL (ref 30.0–36.0)
MCV: 83.3 fL (ref 80.0–100.0)
Platelets: 124 K/uL — ABNORMAL LOW (ref 150–400)
RBC: 5.15 MIL/uL (ref 4.22–5.81)
RDW: 13.4 % (ref 11.5–15.5)
WBC: 5.5 K/uL (ref 4.0–10.5)
nRBC: 0 % (ref 0.0–0.2)

## 2024-11-16 LAB — D-DIMER, QUANTITATIVE: D-Dimer, Quant: 0.7 ug{FEU}/mL — ABNORMAL HIGH (ref 0.00–0.50)

## 2024-11-16 LAB — TROPONIN T, HIGH SENSITIVITY
Troponin T High Sensitivity: <15 ng/L (ref 0–19)
Troponin T High Sensitivity: <15 ng/L (ref 0–19)

## 2024-11-16 LAB — LIPASE, BLOOD: Lipase: 36 U/L (ref 11–51)

## 2024-11-16 MED ORDER — IOHEXOL 350 MG/ML SOLN
75.0000 mL | Freq: Once | INTRAVENOUS | Status: AC | PRN
  Administered 2024-11-16: 75 mL via INTRAVENOUS

## 2024-11-16 MED ORDER — KETOROLAC TROMETHAMINE 30 MG/ML IJ SOLN
30.0000 mg | Freq: Once | INTRAMUSCULAR | Status: AC
  Administered 2024-11-16: 30 mg via INTRAVENOUS

## 2024-11-16 NOTE — Discharge Instructions (Signed)
 You may alternate over the counter Tylenol 1000 mg every 6 hours as needed for pain, fever and Ibuprofen 800 mg every 6-8 hours as needed for pain, fever.  Please take Ibuprofen with food.  Do not take more than 4000 mg of Tylenol (acetaminophen) in a 24 hour period.

## 2024-11-16 NOTE — ED Triage Notes (Signed)
 Pt arrives via EMS from home; st having upper CP for several day with no accomp symptoms; denies hx of same

## 2024-11-16 NOTE — ED Provider Notes (Addendum)
 "  [facility] Provider Note    Event Date/Time   First MD Initiated Contact with Patient 11/16/24 [phone]     (approximate)   History   Chest Pain   HPI  Mathew Brown is a 28 y.o. male with no significant past medical history presents to the emergency department with lower chest and upper abdominal pain worse with deep inspiration that started yesterday.  No fevers, cough, lower extremity swelling or pain, vomiting or diarrhea, shortness of breath.  Took oxycodone that he had leftover from a previous wrist surgery which helped his pain for several hours and then it returned.  Denies any other aggravating or alleviating factors.  Denies history of PE or DVT but does report that he had a left wrist injury requiring external fixation, surgery about 2 to 3 months ago.   History provided by patient.    History reviewed. No pertinent past medical history.  History reviewed. No pertinent surgical history.  MEDICATIONS:  Prior to Admission medications  Not on File    Physical Exam   Triage Vital Signs: ED Triage Vitals  Encounter Vitals Group     BP 11/16/24 0353 (!) 143/85     Girls Systolic BP Percentile --      Girls Diastolic BP Percentile --      Boys Systolic BP Percentile --      Boys Diastolic BP Percentile --      Pulse Rate 11/16/24 0353 66     Resp 11/16/24 0353 18     Temp 11/16/24 0353 98.1 F (36.7 C)     Temp Source 11/16/24 0353 Oral     SpO2 11/16/24 0353 99 %     Weight 11/16/24 0354 220 lb (99.8 kg)     Height 11/16/24 0354 5' 9 (1.753 m)     Head Circumference --      Peak Flow --      Pain Score 11/16/24 0353 7     Pain Loc --      Pain Education --      Exclude from Growth Chart --     Most recent vital signs: Vitals:   11/16/24 0353  BP: (!) 143/85  Pulse: 66  Resp: 18  Temp: 98.1 F (36.7 C)  SpO2: 99%    CONSTITUTIONAL: Alert, responds appropriately to questions. Well-appearing; well-nourished HEAD:  Normocephalic, atraumatic EYES: Conjunctivae clear, pupils appear equal, sclera nonicteric ENT: normal nose; moist mucous membranes NECK: Supple, normal ROM CARD: RRR; S1 and S2 appreciated RESP: Normal chest excursion without splinting or tachypnea; breath sounds clear and equal bilaterally; no wheezes, no rhonchi, no rales, no hypoxia or respiratory distress, speaking full sentences ABD/GI: Non-distended; soft, non-tender, no rebound, no guarding, no peritoneal signs BACK: The back appears normal EXT: Normal ROM in all joints; no deformity noted, no edema, no calf tenderness or calf swelling SKIN: Normal color for age and race; warm; no rash on exposed skin NEURO: Moves all extremities equally, normal speech PSYCH: The patient's mood and manner are appropriate.   ED Results / Procedures / Treatments   LABS: (all labs ordered are listed, but only abnormal results are displayed) Labs Reviewed  COMPREHENSIVE METABOLIC PANEL WITH GFR - Abnormal; Notable for the following components:      Result Value   Glucose, Bld 103 (*)    All other components within normal limits  CBC - Abnormal; Notable for the following components:   Platelets 124 (*)    All other  components within normal limits  D-DIMER, QUANTITATIVE - Abnormal; Notable for the following components:   D-Dimer, Quant 0.70 (*)    All other components within normal limits  LIPASE, BLOOD  TROPONIN T, HIGH SENSITIVITY  TROPONIN T, HIGH SENSITIVITY     EKG:  EKG Interpretation Date/Time:  Tuesday November 16 2024 04:01:20 EST Ventricular Rate:  62 PR Interval:  136 QRS Duration:  102 QT Interval:  396 QTC Calculation: 401 R Axis:   82  Text Interpretation: Normal sinus rhythm with sinus arrhythmia Normal ECG When compared with ECG of 02-Jul-2022 11:47, No significant change was found Confirmed by Neomi Neptune [phone]) on 11/16/2024 4:35:49 AM         RADIOLOGY: My personal review and interpretation of imaging:  Chest x-ray clear.  I have personally reviewed all radiology reports.   DG Chest 2 View Result Date: 11/16/2024 CLINICAL DATA:  Chest pain EXAM: CHEST - 2 VIEW COMPARISON:  07/25/2023 FINDINGS: The lungs are clear without focal pneumonia, edema, pneumothorax or pleural effusion. The cardiopericardial silhouette is within normal limits for size. No acute bony abnormality. IMPRESSION: No active cardiopulmonary disease. Electronically Signed   By: Camellia Candle M.D.   On: 11/16/2024 04:51     PROCEDURES:  Critical Care performed: No     Procedures    IMPRESSION / MDM / ASSESSMENT AND PLAN / ED COURSE  I reviewed the triage vital signs and the nursing notes.    Patient here with pleuritic chest pain.     DIFFERENTIAL DIAGNOSIS (includes but not limited to):   Chest wall pain, PE, less likely ACS, doubt pericarditis, myocarditis, dissection, pneumonia, CHF, malignancy, rib fracture   Patient's presentation is most consistent with acute presentation with potential threat to life or bodily function.   PLAN: EKG nonischemic.  Chest x-ray reviewed and interpreted by myself and the radiologist and is clear.  Given recent extensive surgery and immobilization of the left upper extremity he is higher risk for PE but has no tachycardia, tachypnea or hypoxia here.  Will obtain D-dimer.  No infectious symptoms.  Doubt flu, COVID.  Will give Toradol  for what I suspect is likely musculoskeletal pain and reassess.  He does point to the lower chest and upper abdomen is where his pain is present but his abdominal exam is benign.  Will add on LFTs and lipase but low suspicion for cholelithiasis, cholecystitis, pancreatitis.   MEDICATIONS GIVEN IN ED: Medications  ketorolac  (TORADOL ) 30 MG/ML injection 30 mg (30 mg Intravenous Given 11/16/24 0541)  iohexol  (OMNIPAQUE ) 350 MG/ML injection 75 mL (75 mLs Intravenous Contrast Given 11/16/24 0656)     ED COURSE: D-dimer elevated.  Will proceed with  CTA of the chest.  LFTs, lipase normal.  Patient reports improvement in pain after Toradol .   7:21 AM Pt's CT scan reviewed and interpreted by myself and the radiologist and shows no acute abnormality.  Suspect pain is musculoskeletal in nature.  Recommended Tylenol , Motrin over-the-counter.  Did place PCP referral.   At this time, I do not feel there is any life-threatening condition present. I reviewed all nursing notes, vitals, pertinent previous records.  All lab and urine results, EKGs, imaging ordered have been independently reviewed and interpreted by myself.  I reviewed all available radiology reports from any imaging ordered this visit.  Based on my assessment, I feel the patient is safe to be discharged home without further emergent workup and can continue workup as an outpatient as needed. Discussed all findings, treatment  plan as well as usual and customary return precautions.  They verbalize understanding and are comfortable with this plan.  Outpatient follow-up has been provided as needed.  All questions have been answered.    CONSULTS:  none   OUTSIDE RECORDS REVIEWED: Reviewed recent orthopedic notes.       FINAL CLINICAL IMPRESSION(S) / ED DIAGNOSES   Final diagnoses:  Atypical chest pain     Rx / DC Orders   ED Discharge Orders          Ordered    Ambulatory Referral to Primary Care (Establish Care)        11/16/24 0718             Note:  This document was prepared using Dragon voice recognition software and may include unintentional dictation errors.   Hendry Speas, Josette SAILOR, DO 11/16/24 0719    Kylil Swopes, Josette SAILOR, DO 11/16/24 9277  "
